# Patient Record
Sex: Male | Born: 1980 | Race: Black or African American | Hispanic: No | Marital: Married | Smoking: Never smoker
Health system: Southern US, Community
[De-identification: ages and names within clinical notes are randomized; demographics above are authoritative.]

---

## 2005-04-22 ENCOUNTER — Emergency Department (HOSPITAL_COMMUNITY): Admission: EM | Admit: 2005-04-22 | Discharge: 2005-04-22 | Payer: Self-pay | Admitting: Emergency Medicine

## 2011-07-17 ENCOUNTER — Emergency Department (HOSPITAL_COMMUNITY)
Admission: EM | Admit: 2011-07-17 | Discharge: 2011-07-17 | Disposition: A | Discharge status: Home / Self Care | Payer: 59 | Attending: Emergency Medicine | Admitting: Emergency Medicine

## 2011-07-17 ENCOUNTER — Encounter (HOSPITAL_COMMUNITY): Payer: Self-pay | Admitting: Emergency Medicine

## 2011-07-17 DIAGNOSIS — R51 Headache: Secondary | ICD-10-CM | POA: Insufficient documentation

## 2011-07-17 DIAGNOSIS — R509 Fever, unspecified: Secondary | ICD-10-CM

## 2011-07-17 DIAGNOSIS — N390 Urinary tract infection, site not specified: Secondary | ICD-10-CM | POA: Insufficient documentation

## 2011-07-17 LAB — URINALYSIS, ROUTINE W REFLEX MICROSCOPIC: Nitrite: NEGATIVE

## 2011-07-17 LAB — URINE MICROSCOPIC-ADD ON

## 2011-07-17 LAB — GLUCOSE, CAPILLARY: Glucose-Capillary: 83 mg/dL (ref 70–99)

## 2011-07-17 MED ORDER — CEPHALEXIN 500 MG PO CAPS: 500.0000 mg | ORAL_CAPSULE | Freq: Four times a day (QID) | ORAL | Status: AC

## 2011-07-17 MED ORDER — ACETAMINOPHEN 325 MG PO TABS
650.0000 mg | ORAL_TABLET | Freq: Once | ORAL | Status: AC
  Administered 2011-07-17: 650 mg via ORAL
  Filled 2011-07-17: qty 2

## 2011-07-17 MED ORDER — CEPHALEXIN 250 MG PO CAPS
500.0000 mg | ORAL_CAPSULE | Freq: Once | ORAL | Status: AC
  Administered 2011-07-17: 500 mg via ORAL
  Filled 2011-07-17: qty 2

## 2011-07-17 MED ORDER — IBUPROFEN 200 MG PO TABS
600.0000 mg | ORAL_TABLET | Freq: Once | ORAL | Status: AC
  Administered 2011-07-17: 600 mg via ORAL
  Filled 2011-07-17: qty 3

## 2011-07-17 NOTE — ED Provider Notes (Signed)
History   This chart was scribed for Oscar Co, MD by Toya Smothers. The patient was seen in room STRE8/STRE8. Patient's care was started at 1655.  CSN: [id number]  Arrival date & time 07/17/11  1655   First MD Initiated Contact with Patient 07/17/11 1900      Chief Complaint  Patient presents with  . Chills  . Headache   HPI  Oscar Fields is a 31 y.o. male who presents to the Emergency Department complaining of sudden onset moderate sever constant chills onset 4 hours ago with associated nausea and increased frequency, denying dysuria, hematuria, congestion, and rhinorrhea. Pt states that he was at work when suddenly he began to feel extremely cold. Pt is a nonsmoker and denies but admits the use of alcohol.  History reviewed. No pertinent past medical history.  History reviewed. No pertinent past surgical history.  No family history on file.  History  Substance Use Topics  . Smoking status: Never Smoker   . Smokeless tobacco: Not on file  . Alcohol Use: Yes   Review of Systems  Constitutional: Positive for chills.  HENT: Negative for congestion, sore throat and rhinorrhea.   Respiratory: Negative for shortness of breath.   Gastrointestinal: Negative for nausea, vomiting, abdominal pain, diarrhea and blood in stool.  Genitourinary: Positive for frequency. Negative for dysuria and hematuria.  Neurological: Negative for weakness.  A complete 10 system review of systems was obtained and all systems are negative except as noted in the HPI and PMH.    Allergies  Review of patient's allergies indicates no known allergies.  Home Medications  No current outpatient prescriptions on file.  BP 125/77  Temp(Src) 100.4 F (38 C) (Oral)  Resp 20  SpO2 95%  Physical Exam  Nursing note and vitals reviewed. Constitutional: He is oriented to person, place, and time. He appears well-developed and well-nourished. No distress.  HENT:  Head: Normocephalic and atraumatic.    Eyes: EOM are normal.  Neck: Neck supple. No tracheal deviation present.  Cardiovascular: Normal rate, regular rhythm and normal heart sounds.  Exam reveals no gallop and no friction rub.   No murmur heard. Pulmonary/Chest: Effort normal and breath sounds normal. No respiratory distress. He has no wheezes. He has no rales.  Musculoskeletal: Normal range of motion.  Neurological: He is alert and oriented to person, place, and time.  Skin: Skin is warm and dry.  Psychiatric: He has a normal mood and affect. His behavior is normal.    ED Course  Procedures (including critical care time)  DIAGNOSTIC STUDIES: Oxygen Saturation is 95% on room air, adequate by my interpretation.    COORDINATION OF CARE: 7:00PM- Evaluated Pt's current condition  DIAGNOSTIC STUDIES: Oxygen Saturation is 95% on room  air, normal by my interpretation.    COORDINATION OF CARE:    Labs Reviewed  URINALYSIS, ROUTINE W REFLEX MICROSCOPIC - Abnormal; Notable for the following:    Leukocytes, UA SMALL (*)    All other components within normal limits  GLUCOSE, CAPILLARY  URINE MICROSCOPIC-ADD ON   No results found.   1. Fever   2. Urinary tract infection       MDM  The only symptoms regarding infection that this patient has a some urinary frequency.  He does have some leukocytes and a few white blood cells in his urine.  He'll be covered for possible urinary tract infection.  This may represent viral syndrome.  Discharge home in good condition.  The patient understands  to return to the ER for new or worsening symptoms.  He reports he feels much better after antifever medicine at this time   I personally performed the services described in this documentation, which was scribed in my presence. The recorded information has been reviewed and considered.          Oscar Co, MD 07/17/11 2013

## 2011-07-17 NOTE — Discharge Instructions (Signed)
Fever  Fever is a higher-than-normal body temperature. A normal temperature varies with:  Age.   How it is measured (mouth, underarm, rectal, or ear).   Time of day.  In an adult, an oral temperature around 98.6 Fahrenheit (F) or 37 Celsius (C) is considered normal. A rise in temperature of about 1.8 F or 1 C is generally considered a fever (100.4 F or 38 C). In an infant age 32 days or less, a rectal temperature of 100.4 F (38 C) generally is regarded as fever. Fever is not a disease but can be a symptom of illness. CAUSES   Fever is most commonly caused by infection.   Some non-infectious problems can cause fever. For example:   Some arthritis problems.   Problems with the thyroid or adrenal glands.   Immune system problems.   Some kinds of cancer.   A reaction to certain medicines.   Occasionally, the source of a fever cannot be determined. This is sometimes called a "Fever of Unknown Origin" (FUO).   Some situations may lead to a temporary rise in body temperature that may go away on its own. Examples are:   Childbirth.   Surgery.   Some situations may cause a rise in body temperature but these are not considered "true fever". Examples are:   Intense exercise.   Dehydration.   Exposure to high outside or room temperatures.  SYMPTOMS   Feeling warm or hot.   Fatigue or feeling exhausted.   Aching all over.   Chills.   Shivering.   Sweats.  DIAGNOSIS  A fever can be suspected by your caregiver feeling that your skin is unusually warm. The fever is confirmed by taking a temperature with a thermometer. Temperatures can be taken different ways. Some methods are accurate and some are not: With adults, adolescents, and children:   An oral temperature is used most commonly.   An ear thermometer will only be accurate if it is positioned as recommended by the manufacturer.   Under the arm temperatures are not accurate and not recommended.   Most  electronic thermometers are fast and accurate.  Infants and Toddlers:  Rectal temperatures are recommended and most accurate.   Ear temperatures are not accurate in this age group and are not recommended.   Skin thermometers are not accurate.  RISKS AND COMPLICATIONS   During a fever, the body uses more oxygen, so a person with a fever may develop rapid breathing or shortness of breath. This can be dangerous especially in people with heart or lung disease.   The sweats that occur following a fever can cause dehydration.   High fever can cause seizures in infants and children.   Older persons can develop confusion during a fever.  TREATMENT   Medications may be used to control temperature.   Do not give aspirin to children with fevers. There is an association with Reye's syndrome. Reye's syndrome is a rare but potentially deadly disease.   If an infection is present and medications have been prescribed, take them as directed. Finish the full course of medications until they are gone.   Sponging or bathing with room-temperature water may help reduce body temperature. Do not use ice water or alcohol sponge baths.   Do not over-bundle children in blankets or heavy clothes.   Drinking adequate fluids during an illness with fever is important to prevent dehydration.  HOME CARE INSTRUCTIONS   For adults, rest and adequate fluid intake are important. Dress according   to how you feel, but do not over-bundle.   Drink enough water and/or fluids to keep your urine clear or pale yellow.   For infants over 3 months and children, giving medication as directed by your caregiver to control fever can help with comfort. The amount to be given is based on the child's weight. Do NOT give more than is recommended.  SEEK MEDICAL CARE IF:   You or your child are unable to keep fluids down.   Vomiting or diarrhea develops.   You develop a skin rash.   An oral temperature above 102 F (38.9 C)  develops, or a fever which persists for over 3 days.   You develop excessive weakness, dizziness, fainting or extreme thirst.   Fevers keep coming back after 3 days.  SEEK IMMEDIATE MEDICAL CARE IF:   Shortness of breath or trouble breathing develops   You pass out.   You feel you are making little or no urine.   New pain develops that was not there before (such as in the head, neck, chest, back, or abdomen).   You cannot hold down fluids.   Vomiting and diarrhea persist for more than a day or two.   You develop a stiff neck and/or your eyes become sensitive to light.   An unexplained temperature above 102 F (38.9 C) develops.  Document Released: 02/07/2005 Document Revised: 01/27/2011 Document Reviewed: 01/24/2008 [employer] Patient Information 2012 [employer]. 

## 2011-07-17 NOTE — ED Notes (Signed)
Pt reports while at work today he began to experience generalized chills and body aches. Pt denies any associating symptoms - pt in no acute distress at present, A&Ox4.

## 2011-07-17 NOTE — ED Notes (Addendum)
C/o chills, body aches, dry mouth, blurred vision, and "slight" headache that started around 2:30pm today while at work.

## 2011-07-17 NOTE — ED Notes (Signed)
[facility] 83 Rn notified Clydie Braun

## 2012-10-19 ENCOUNTER — Ambulatory Visit: Payer: 59 | Admitting: Internal Medicine

## 2012-11-13 ENCOUNTER — Ambulatory Visit (INDEPENDENT_AMBULATORY_CARE_PROVIDER_SITE_OTHER): Payer: 59 | Admitting: Medical

## 2012-11-13 ENCOUNTER — Encounter: Payer: Self-pay | Admitting: Medical

## 2012-11-13 VITALS — BP 122/80 | HR 78 | Temp 98.2°F | Resp 16 | Ht 70.2 in | Wt 315.0 lb

## 2012-11-13 DIAGNOSIS — B350 Tinea barbae and tinea capitis: Secondary | ICD-10-CM

## 2012-11-13 DIAGNOSIS — Z23 Encounter for immunization: Secondary | ICD-10-CM

## 2012-11-13 DIAGNOSIS — Z0000 Encounter for general adult medical examination without abnormal findings: Secondary | ICD-10-CM

## 2012-11-13 DIAGNOSIS — L738 Other specified follicular disorders: Secondary | ICD-10-CM

## 2012-11-13 DIAGNOSIS — Z7184 Encounter for health counseling related to travel: Secondary | ICD-10-CM

## 2012-11-13 DIAGNOSIS — E669 Obesity, unspecified: Secondary | ICD-10-CM

## 2012-11-13 DIAGNOSIS — K029 Dental caries, unspecified: Secondary | ICD-10-CM

## 2012-11-13 DIAGNOSIS — Z7189 Other specified counseling: Secondary | ICD-10-CM

## 2012-11-13 LAB — COMPREHENSIVE METABOLIC PANEL
ALT: 28 U/L (ref 0–53)
AST: 22 U/L (ref 0–37)
Albumin: 4.2 g/dL (ref 3.5–5.2)
Alkaline Phosphatase: 74 U/L (ref 39–117)
BUN: 10 mg/dL (ref 6–23)
Calcium: 9.6 mg/dL (ref 8.4–10.5)
Creat: 1.02 mg/dL (ref 0.50–1.35)
Glucose, Bld: 86 mg/dL (ref 70–99)
Potassium: 4.3 mEq/L (ref 3.5–5.3)
Sodium: 138 mEq/L (ref 135–145)
Total Bilirubin: 1.2 mg/dL (ref 0.3–1.2)
Total Protein: 7.4 g/dL (ref 6.0–8.3)

## 2012-11-13 LAB — LIPID PANEL
Cholesterol: 211 mg/dL — ABNORMAL HIGH (ref 0–200)
Total CHOL/HDL Ratio: 5.1 Ratio
Triglycerides: 104 mg/dL (ref <150)
VLDL: 21 mg/dL (ref 0–40)

## 2012-11-13 LAB — POCT URINALYSIS DIPSTICK
Leukocytes, UA: NEGATIVE
Nitrite, UA: NEGATIVE

## 2012-11-13 LAB — CBC WITH DIFFERENTIAL/PLATELET
Basophils Absolute: 0 10*3/uL (ref 0.0–0.1)
Basophils Relative: 0 % (ref 0–1)
Lymphs Abs: 1.9 10*3/uL (ref 0.7–4.0)
Monocytes Relative: 9 % (ref 3–12)
Neutrophils Relative %: 65 % (ref 43–77)
WBC: 7.6 10*3/uL (ref 4.0–10.5)

## 2012-11-13 MED ORDER — TRETINOIN 0.05 % EX CREA: TOPICAL_CREAM | Freq: Every day | CUTANEOUS | Status: DC

## 2012-11-13 MED ORDER — LORCASERIN HCL 10 MG PO TABS: 1.0000 | ORAL_TABLET | Freq: Two times a day (BID) | ORAL | Status: DC

## 2012-11-13 NOTE — Addendum Note (Signed)
Addended by: Jac Canavan on: 11/13/2012 01:37 PM   Modules accepted: Orders

## 2012-11-13 NOTE — Patient Instructions (Addendum)
Recommendations: See dentist for routine care.  Dr. Yancey Flemings, dentist [address], [place], [place] [address] [phone] [email or web address]  Begin Belviq tablets for weight loss.  Work on getting at least 150 minutes of exercise daily.  Work on [employer] changes we discussed.    Increase water intake.   Recheck 1-2 months regarding weight.    Preventative Care for Adults, Male       REGULAR HEALTH EXAMS:  A routine yearly physical is a good way to check in with your primary care provider about your health and preventive screening. It is also an opportunity to share updates about your health and any concerns you have, and receive a thorough all-over exam.   Most health insurance companies pay for at least some preventative services.  Check with your health plan for specific coverages.  WHAT PREVENTATIVE SERVICES DO MEN NEED?  Adult men should have their weight and blood pressure checked regularly.   Men age 8 and older should have their cholesterol levels checked regularly.  Beginning at age 69 and continuing to age 26, men should be screened for colorectal cancer.  Certain people should may need continued testing until age 36.  Other cancer screening may include exams for testicular and prostate cancer.  Updating vaccinations is part of preventative care.  Vaccinations help protect against diseases such as the flu.  Lab tests are generally done as part of preventative care to screen for anemia and blood disorders, to screen for problems with the kidneys and liver, to screen for bladder problems, to check blood sugar, and to check your cholesterol level.  Preventative services generally include counseling about diet, exercise, avoiding tobacco, drugs, excessive alcohol consumption, and sexually transmitted infections.    GENERAL RECOMMENDATIONS FOR GOOD HEALTH:  Healthy diet:  Eat a variety of foods, including fruit, vegetables, animal or vegetable protein, such as  meat, fish, chicken, and eggs, or beans, lentils, tofu, and grains, such as rice.  Drink plenty of water daily.  Decrease saturated fat in the diet, avoid lots of red meat, processed foods, sweets, fast foods, and fried foods.  Exercise:  Aerobic exercise helps maintain good heart health. At least 30-40 minutes of moderate-intensity exercise is recommended. For example, a brisk walk that increases your heart rate and breathing. This should be done on most days of the week.   Find a type of exercise or a variety of exercises that you enjoy so that it becomes a part of your daily life.  Examples are running, walking, swimming, water aerobics, and biking.  For motivation and support, explore group exercise such as aerobic class, spin class, Zumba, Yoga,or  martial arts, etc.    Set exercise goals for yourself, such as a certain weight goal, walk or run in a race such as a 5k walk/run.  Speak to your primary care provider about exercise goals.  Disease prevention:  If you smoke or chew tobacco, find out from your caregiver how to quit. It can literally save your life, no matter how long you have been a tobacco user. If you do not use tobacco, never begin.   Maintain a healthy diet and normal weight. Increased weight leads to problems with blood pressure and diabetes.   The Body Mass Index or BMI is a way of measuring how much of your body is fat. Having a BMI above 27 increases the risk of heart disease, diabetes, hypertension, stroke and other problems related to obesity. Your caregiver can help determine your  BMI and based on it develop an exercise and dietary program to help you achieve or maintain this important measurement at a healthful level.  High blood pressure causes heart and blood vessel problems.  Persistent high blood pressure should be treated with medicine if weight loss and exercise do not work.   Fat and cholesterol leaves deposits in your arteries that can block them. This  causes heart disease and vessel disease elsewhere in your body.  If your cholesterol is found to be high, or if you have heart disease or certain other medical conditions, then you may need to have your cholesterol monitored frequently and be treated with medication.   Ask if you should have a stress test if your history suggests this. A stress test is a test done on a treadmill that looks for heart disease. This test can find disease prior to there being a problem.  Avoid drinking alcohol in excess (more than two drinks per day).  Avoid use of street drugs. Do not share needles with anyone. Ask for professional help if you need assistance or instructions on stopping the use of alcohol, cigarettes, and/or drugs.  Brush your teeth twice a day with fluoride toothpaste, and floss once a day. Good oral hygiene prevents tooth decay and gum disease. The problems can be painful, unattractive, and can cause other health problems. Visit your dentist for a routine oral and dental check up and preventive care every 6-12 months.   Look at your skin regularly.  Use a mirror to look at your back. Notify your caregivers of changes in moles, especially if there are changes in shapes, colors, a size larger than a pencil eraser, an irregular border, or development of new moles.  Safety:  Use seatbelts 100% of the time, whether driving or as a passenger.  Use safety devices such as hearing protection if you work in environments with loud noise or significant background noise.  Use safety glasses when doing any work that could send debris in to the eyes.  Use a helmet if you ride a bike or motorcycle.  Use appropriate safety gear for contact sports.  Talk to your caregiver about gun safety.  Use sunscreen with a SPF (or skin protection factor) of 15 or greater.  Lighter skinned people are at a greater risk of skin cancer. Don't forget to also wear sunglasses in order to protect your eyes from too much damaging sunlight.  Damaging sunlight can accelerate cataract formation.   Practice safe sex. Use condoms. Condoms are used for birth control and to help reduce the spread of sexually transmitted infections (or STIs).  Some of the STIs are gonorrhea (the clap), chlamydia, syphilis, trichomonas, herpes, HPV (human papilloma virus) and HIV (human immunodeficiency virus) which causes AIDS. The herpes, HIV and HPV are viral illnesses that have no cure. These can result in disability, cancer and death.   Keep carbon monoxide and smoke detectors in your home functioning at all times. Change the batteries every 6 months or use a model that plugs into the wall.   Vaccinations:  Stay up to date with your tetanus shots and other required immunizations. You should have a booster for tetanus every 10 years. Be sure to get your flu shot every year, since 5%-20% of the [place] population comes down with the flu. The flu vaccine changes each year, so being vaccinated once is not enough. Get your shot in the fall, before the flu season peaks.   Other vaccines to consider:  Pneumococcal vaccine to protect against certain types of pneumonia.  This is normally recommended for adults age 1 or older.  However, adults younger than 33 years old with certain underlying conditions such as diabetes, heart or lung disease should also receive the vaccine.  Shingles vaccine to protect against Varicella Zoster if you are older than age 42, or younger than 33 years old with certain underlying illness.  Hepatitis A vaccine to protect against a form of infection of the liver by a virus acquired from food.  Hepatitis B vaccine to protect against a form of infection of the liver by a virus acquired from blood or body fluids, particularly if you work in health care.  If you plan to travel internationally, check with your local health department for specific vaccination recommendations.  Cancer Screening:  Most routine colon cancer screening begins at  the age of 74. On a yearly basis, doctors may provide special easy to use take-home tests to check for hidden blood in the stool. Sigmoidoscopy or colonoscopy can detect the earliest forms of colon cancer and is life saving. These tests use a small camera at the end of a tube to directly examine the colon. Speak to your caregiver about this at age 39, when routine screening begins (and is repeated every 5 years unless early forms of pre-cancerous polyps or small growths are found).   At the age of 60 men usually start screening for prostate cancer every year. Screening may begin at a younger age for those with higher risk. Those at higher risk include African-Americans or having a family history of prostate cancer. There are two types of tests for prostate cancer:   Prostate-specific antigen (PSA) testing. Recent studies raise questions about prostate cancer using PSA and you should discuss this with your caregiver.   Digital rectal exam (in which your doctor's lubricated and gloved finger feels for enlargement of the prostate through the anus).   Screening for testicular cancer.  Do a monthly exam of your testicles. Gently roll each testicle between your thumb and fingers, feeling for any abnormal lumps. The best time to do this is after a hot shower or bath when the tissues are looser. Notify your caregivers of any lumps, tenderness or changes in size or shape immediately.

## 2012-11-13 NOTE — Progress Notes (Signed)
Subjective:   HPI  Oscar Fields is a 32 y.o. male who presents for a complete physical.  New patient today. No routine care since he was a child.     Preventative care: Last ophthalmology visit:n/a Last dental visit:n/a Last colonoscopy:n/a Last prostate exam: never Last JYN:WGNFA Last labs: ?  Prior vaccinations: Unsure, born in [place]  Advanced directive:n/a Health care power of attorney:n/a Living will:n/a  Concerns:  Obesity - has had trouble losing weight for years.  Gained weight in the last 3-4 years; busy with life, has young child, works a lot of hours, trying to get exercise but difficult.   Travel - traveling to [place] in December, wife has family there.  Wants advise on vaccines, travel.    History reviewed. No pertinent past medical history.  History reviewed. No pertinent past surgical history.  History   Social History  . Marital Status: Married    Spouse Name: N/A    Number of Children: N/A  . Years of Education: N/A   Occupational History  . Not on file.   Social History Main Topics  . Smoking status: Never Smoker   . Smokeless tobacco: Not on file  . Alcohol Use: 1.8 oz/week    3 Cans of beer per week  . Drug Use: No  . Sexual Activity: Not on file   Other Topics Concern  . 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  Exercises 2 days per week with basketball and golf.     Family History  Problem Relation Age of Onset  . Diabetes Paternal Grandmother   . Cancer Neg Hx   . Heart disease Neg Hx   . Stroke Neg Hx   . Hypertension Neg Hx     Current outpatient prescriptions:Lorcaserin HCl (BELVIQ) 10 MG TABS, Take 1 tablet by mouth 2 (two) times daily., Disp: 60 tablet, Rfl: 1  No Known Allergies  Reviewed their medical, surgical, family, social, medication, and allergy history and updated chart as appropriate.   Review of Systems Constitutional: -fever, -chills,  -sweats, -unexpected weight change, -decreased appetite, -fatigue Allergy: -sneezing, -itching, -congestion Dermatology: -changing moles, --rash, -lumps ENT: -runny nose, -ear pain, -sore throat, -hoarseness, -sinus pain, -teeth pain, - ringing in ears, -hearing loss, -nosebleeds Cardiology: -chest pain, -palpitations, -swelling, -difficulty breathing when lying flat, -waking up short of breath Respiratory: -cough, -shortness of breath, -difficulty breathing with exercise or exertion, -wheezing, -coughing up blood Gastroenterology: -abdominal pain, -nausea, -vomiting, -diarrhea, -constipation, -blood in stool, -changes in bowel movement, -difficulty swallowing or eating Hematology: -bleeding, -bruising  Musculoskeletal: -joint aches, -muscle aches, -joint swelling, -back pain, -neck pain, -cramping, -changes in gait Ophthalmology: denies vision changes, eye redness, itching, discharge Urology: -burning with urination, -difficulty urinating, -blood in urine, -urinary frequency, -urgency, -incontinence Neurology: -headache, -weakness, -tingling, -numbness, -memory loss, -falls, -dizziness Psychology: -depressed mood, -agitation, -sleep problems     Objective:   Physical Exam  BP 122/80  Pulse 78  Temp(Src) 98.2 F (36.8 C) (Oral)  Resp 16  Ht 5' 10.2" (1.783 m)  Wt 315 lb (142.883 kg)  BMI 44.94 kg/m2  General appearance: alert, no distress, WD/WN, obese AA male Skin: no worrisome lesions, few benign appearing macules seen, bear area with some papular lesoins, mild folliculitis barbae HEENT: normocephalic, conjunctiva/corneas normal, sclerae anicteric, PERRLA, EOMi, nares patent, no discharge or erythema, pharynx normal Oral cavity: MMM, tongue normal, left upper molar with obvious decay, otherwise  rest of teeth in good repair Neck: supple, no lymphadenopathy, no thyromegaly, no masses, normal ROM, no JVD or bruits Chest: non tender, normal shape and expansion Heart: RRR, normal S1,  S2, no murmurs Lungs: CTA bilaterally, no wheezes, rhonchi, or rales Abdomen: +bs, soft, non tender, non distended, no masses, no hepatomegaly, no splenomegaly, no bruits Back: non tender, normal ROM, no scoliosis Musculoskeletal: upper extremities non tender, no obvious deformity, normal ROM throughout, lower extremities non tender, no obvious deformity, normal ROM throughout Extremities: no edema, no cyanosis, no clubbing Pulses: 2+ symmetric, upper and lower extremities, normal cap refill Neurological: alert, oriented x 3, CN2-12 intact, strength normal upper extremities and lower extremities, sensation normal throughout, DTRs 2+ throughout, no cerebellar signs, gait normal Psychiatric: normal affect, behavior normal, pleasant  GU: normal male external genitalia, circumcised, nontender, no masses, no hernia, no lymphadenopathy Rectal: deferred   Assessment and Plan :      Encounter Diagnoses  Name Primary?  . Routine general medical examination at a health care facility Yes  . Obesity, unspecified   . Need for Tdap vaccination   . Need for prophylactic vaccination and inoculation against influenza   . Counseling about travel   . Folliculitis barbae   . Dental caries     Physical exam - discussed healthy lifestyle, diet, exercise, preventative care, vaccinations, and addressed their concerns.   Advise he see dentist.   Counseled on the Tdap (tetanus, diptheria, and acellular pertussis) vaccine.  Vaccine information sheet given. Tdap vaccine given after consent obtained. Counseled on the influenza virus vaccine.  Vaccine information sheet given.  Influenza vaccine given after consent obtained.  Obesity - General weight loss/lifestyle modification strategies discussed including calorie restriction, using frequent small meals, avoiding processed, high fat, high cal, fast, and fried foods.  Increase water intake.  Set short term goals.  Informal exercise measures discussed, e.g. taking  stairs instead of elevator.  Regular aerobic exercise advised.  Discussed medication options.  Discussed risks/benefits of medications.  Prescription today for Belviq..  Travel counseling for [place] per [employer] site.    Advised he hunt down copies of his child hood vaccines or we can do titers.  He will try and get copy of vaccines . He thinks he has had Hepatitis vaccines prior thought.  He will consider Typhoid vaccine.  Per where is he going in [place], CDC recommends against malaria prophylaxis.  I gave him the option of malaria prophylaxis and he declines at this time.  Discussed other routine safety and health counseling for [place] travel.   Follow-up pending labs, 58mo

## 2012-11-14 ENCOUNTER — Telehealth: Payer: Self-pay | Admitting: Family Medicine

## 2012-11-14 NOTE — Telephone Encounter (Signed)
Patient is aware of the medication that was sent to the pharmacy. CLS 

## 2012-11-14 NOTE — Telephone Encounter (Signed)
Message copied by Janeice Robinson on Wed Nov 14, 2012  4:12 PM ------      Message from: Jac Canavan      Created: Tue Nov 13, 2012  1:37 PM       I forgot to mention, but I sent prescription for retin A cream to begin using at bedtime for his beard bumps.  This is called Folliculitis Barbae.  Lets see if this medication helps.  ------

## 2014-02-16 ENCOUNTER — Emergency Department (HOSPITAL_BASED_OUTPATIENT_CLINIC_OR_DEPARTMENT_OTHER): Payer: BC Managed Care – PPO

## 2014-02-16 ENCOUNTER — Encounter (HOSPITAL_BASED_OUTPATIENT_CLINIC_OR_DEPARTMENT_OTHER): Payer: Self-pay

## 2014-02-16 ENCOUNTER — Emergency Department (HOSPITAL_BASED_OUTPATIENT_CLINIC_OR_DEPARTMENT_OTHER)
Admission: EM | Admit: 2014-02-16 | Discharge: 2014-02-16 | Disposition: A | Discharge status: Home / Self Care | Payer: BC Managed Care – PPO | Attending: Emergency Medicine | Admitting: Emergency Medicine

## 2014-02-16 DIAGNOSIS — S63501A Unspecified sprain of right wrist, initial encounter: Secondary | ICD-10-CM

## 2014-02-16 DIAGNOSIS — S6991XA Unspecified injury of right wrist, hand and finger(s), initial encounter: Secondary | ICD-10-CM | POA: Diagnosis present

## 2014-02-16 DIAGNOSIS — Y9289 Other specified places as the place of occurrence of the external cause: Secondary | ICD-10-CM | POA: Insufficient documentation

## 2014-02-16 DIAGNOSIS — Y9389 Activity, other specified: Secondary | ICD-10-CM | POA: Diagnosis not present

## 2014-02-16 DIAGNOSIS — Y998 Other external cause status: Secondary | ICD-10-CM | POA: Insufficient documentation

## 2014-02-16 DIAGNOSIS — W19XXXA Unspecified fall, initial encounter: Secondary | ICD-10-CM

## 2014-02-16 DIAGNOSIS — W1839XA Other fall on same level, initial encounter: Secondary | ICD-10-CM | POA: Insufficient documentation

## 2014-02-16 MED ORDER — IBUPROFEN 800 MG PO TABS: 800.0000 mg | ORAL_TABLET | Freq: Three times a day (TID) | ORAL | Status: AC

## 2014-02-16 NOTE — ED Provider Notes (Signed)
CSN: [id number]     Arrival date & time 02/16/14  1116 History   First MD Initiated Contact with Patient 02/16/14 1156     Chief Complaint  Patient presents with  . Wrist Injury     (Consider location/radiation/quality/duration/timing/severity/associated sxs/prior Treatment) HPI Comments: Patient presents for right wrist injury. Patient reports that he fell last night and tried to catch himself by putting his arm out. He has been having pain on the lateral portion of the wrist since the fall. Pain is moderate, worsens with movement. No other injury.  Patient is a 33 y.o. male presenting with wrist injury.  Wrist Injury   History reviewed. No pertinent past medical history. History reviewed. No pertinent past surgical history. Family History  Problem Relation Age of Onset  . Diabetes Paternal Grandmother   . Cancer Neg Hx   . Heart disease Neg Hx   . Stroke Neg Hx   . Hypertension Neg Hx    History  Substance Use Topics  . Smoking status: Never Smoker   . Smokeless tobacco: Not on file  . Alcohol Use: 1.8 oz/week    3 Cans of beer per week    Review of Systems  Musculoskeletal: Positive for arthralgias.      Allergies  Review of patient's allergies indicates no known allergies.  Home Medications   Prior to Admission medications   Not on File   BP 139/91 mmHg  Pulse 84  Temp(Src) 98.6 F (37 C) (Oral)  Resp 18  SpO2 96% Physical Exam  Musculoskeletal:       Right wrist: He exhibits tenderness. He exhibits no swelling and no deformity.       Arms: Neurological: He is alert. No cranial nerve deficit or sensory deficit. GCS eye subscore is 4. GCS verbal subscore is 5. GCS motor subscore is 6.  Skin: Skin is warm, dry and intact.    ED Course  Procedures (including critical care time) Labs Review Labs Reviewed - No data to display  Imaging Review Dg Wrist Complete Right  02/16/2014   CLINICAL DATA:  Fall last night, acute right wrist pain and injury   EXAM: RIGHT WRIST - COMPLETE 3+ VIEW  COMPARISON:  None.  FINDINGS: There is no evidence of fracture or dislocation. There is no evidence of arthropathy or other focal bone abnormality. Soft tissues are unremarkable.  IMPRESSION: Negative.   Electronically Signed   By: Ruel Favorsrevor  Shick M.D.   On: 02/16/2014 12:21     EKG Interpretation None      MDM   Final diagnoses:  Fall  wrist sprain      Gilda Creasehristopher J. Pollina, MD 02/16/14 [phone]

## 2014-02-16 NOTE — Discharge Instructions (Signed)
Wrist Sprain  with Rehab  A sprain is an injury in which a ligament that maintains the proper alignment of a joint is partially or completely torn. The ligaments of the wrist are susceptible to sprains. Sprains are classified into three categories. Grade 1 sprains cause pain, but the tendon is not lengthened. Grade 2 sprains include a lengthened ligament because the ligament is stretched or partially ruptured. With grade 2 sprains there is still function, although the function may be diminished. Grade 3 sprains are characterized by a complete tear of the tendon or muscle, and function is usually impaired.  SYMPTOMS   · Pain tenderness, inflammation, and/or bruising (contusion) of the injury.  · A "pop" or tear felt and/or heard at the time of injury.  · Decreased wrist function.  CAUSES   A wrist sprain occurs when a force is placed on one or more ligaments that is greater than it/they can withstand. Common mechanisms of injury include:  · Catching a ball with you hands.  · Repetitive and/ or strenuous extension or flexion of the wrist.  RISK INCREASES WITH:  · Previous wrist injury.  · Contact sports (boxing or wrestling).  · Activities in which falling is common.  · Poor strength and flexibility.  · Improperly fitted or padded protective equipment.  PREVENTION  · Warm up and stretch properly before activity.  · Allow for adequate recovery between workouts.  · Maintain physical fitness:  ¨ Strength, flexibility, and endurance.  ¨ Cardiovascular fitness.  · Protect the wrist joint by limiting its motion with the use of taping, braces, or splints.  · Protect the wrist after injury for 6 to 12 months.  PROGNOSIS   The prognosis for wrist sprains depends on the degree of injury. Grade 1 sprains require 2 to 6 weeks of treatment. Grade 2 sprains require 6 to 8 weeks of treatment, and grade 3 sprains require up to 12 weeks.   RELATED COMPLICATIONS   · Prolonged healing time, if improperly treated or  re-injured.  · Recurrent symptoms that result in a chronic problem.  · Injury to nearby structures (bone, cartilage, nerves, or tendons).  · Arthritis of the wrist.  · Inability to compete in athletics at a high level.  · Wrist stiffness or weakness.  · Progression to a complete rupture of the ligament.  TREATMENT   Treatment initially involves resting from any activities that aggravate the symptoms, and the use of ice and medications to help reduce pain and inflammation. Your caregiver may recommend immobilizing the wrist for a period of time in order to reduce stress on the ligament and allow for healing. After immobilization it is important to perform strengthening and stretching exercises to help regain strength and a full range of motion. These exercises may be completed at home or with a therapist. Surgery is not usually required for wrist sprains, unless the ligament has been ruptured (grade 3 sprain).  MEDICATION   · If pain medication is necessary, then nonsteroidal anti-inflammatory medications, such as aspirin and ibuprofen, or other minor pain relievers, such as acetaminophen, are often recommended.  · Do not take pain medication for 7 days before surgery.  · Prescription pain relievers may be given if deemed necessary by your caregiver. Use only as directed and only as much as you need.  HEAT AND COLD  · Cold treatment (icing) relieves pain and reduces inflammation. Cold treatment should be applied for 10 to 15 minutes every 2 to 3 hours for inflammation   and pain and immediately after any activity that aggravates your symptoms. Use ice packs or massage the area with a piece of ice (ice massage).  · Heat treatment may be used prior to performing the stretching and strengthening activities prescribed by your caregiver, physical therapist, or athletic trainer. Use a heat pack or soak your injury in warm water.  SEEK MEDICAL CARE IF:  · Treatment seems to offer no benefit, or the condition worsens.  · Any  medications produce adverse side effects.  EXERCISES  RANGE OF MOTION (ROM) AND STRETCHING EXERCISES - Wrist Sprain   These exercises may help you when beginning to rehabilitate your injury. Your symptoms may resolve with or without further involvement from your physician, physical therapist or athletic trainer. While completing these exercises, remember:   · Restoring tissue flexibility helps normal motion to return to the joints. This allows healthier, less painful movement and activity.  · An effective stretch should be held for at least 30 seconds.  · A stretch should never be painful. You should only feel a gentle lengthening or release in the stretched tissue.  RANGE OF MOTION - Wrist Flexion, Active-Assisted  · Extend your right / left elbow with your fingers pointing down.*  · Gently pull the back of your hand towards you until you feel a gentle stretch on the top of your forearm.  · Hold this position for __________ seconds.  Repeat __________ times. Complete this exercise __________ times per day.   *If directed by your physician, physical therapist or athletic trainer, complete this stretch with your elbow bent rather than extended.  RANGE OF MOTION - Wrist Extension, Active-Assisted  · Extend your right / left elbow and turn your palm upwards.*  · Gently pull your palm/fingertips back so your wrist extends and your fingers point more toward the ground.  · You should feel a gentle stretch on the inside of your forearm.  · Hold this position for __________ seconds.  Repeat __________ times. Complete this exercise __________ times per day.  *If directed by your physician, physical therapist or athletic trainer, complete this stretch with your elbow bent, rather than extended.  RANGE OF MOTION - Supination, Active  · Stand or sit with your elbows at your side. Bend your right / left elbow to 90 degrees.  · Turn your palm upward until you feel a gentle stretch on the inside of your forearm.  · Hold this  position for __________ seconds. Slowly release and return to the starting position.  Repeat __________ times. Complete this stretch __________ times per day.   RANGE OF MOTION - Pronation, Active  · Stand or sit with your elbows at your side. Bend your right / left elbow to 90 degrees.  · Turn your palm downward until you feel a gentle stretch on the top of your forearm.  · Hold this position for __________ seconds. Slowly release and return to the starting position.  Repeat __________ times. Complete this stretch __________ times per day.   STRETCH - Wrist Flexion  · Place the back of your right / left hand on a tabletop leaving your elbow slightly bent. Your fingers should point away from your body.  · Gently press the back of your hand down onto the table by straightening your elbow. You should feel a stretch on the top of your forearm.  · Hold this position for __________ seconds.  Repeat __________ times. Complete this stretch __________ times per day.   STRETCH - Wrist   Extension  · Place your right / left fingertips on a tabletop leaving your elbow slightly bent. Your fingers should point backwards.  · Gently press your fingers and palm down onto the table by straightening your elbow. You should feel a stretch on the inside of your forearm.  · Hold this position for __________ seconds.  Repeat __________ times. Complete this stretch __________ times per day.   STRENGTHENING EXERCISES - Wrist Sprain  These exercises may help you when beginning to rehabilitate your injury. They may resolve your symptoms with or without further involvement from your physician, physical therapist or athletic trainer. While completing these exercises, remember:   · Muscles can gain both the endurance and the strength needed for everyday activities through controlled exercises.  · Complete these exercises as instructed by your physician, physical therapist or athletic trainer. Progress with the resistance and repetition exercises  only as your caregiver advises.  STRENGTH - Wrist Flexors  · Sit with your right / left forearm palm-up and fully supported. Your elbow should be resting below the height of your shoulder. Allow your wrist to extend over the edge of the surface.  · Loosely holding a __________ weight or a piece of rubber exercise band/tubing, slowly curl your hand up toward your forearm.  · Hold this position for __________ seconds. Slowly lower the wrist back to the starting position in a controlled manner.  Repeat __________ times. Complete this exercise __________ times per day.   STRENGTH - Wrist Extensors  · Sit with your right / left forearm palm-down and fully supported. Your elbow should be resting below the height of your shoulder. Allow your wrist to extend over the edge of the surface.  · Loosely holding a __________ weight or a piece of rubber exercise band/tubing, slowly curl your hand up toward your forearm.  · Hold this position for __________ seconds. Slowly lower the wrist back to the starting position in a controlled manner.  Repeat __________ times. Complete this exercise __________ times per day.   STRENGTH - Ulnar Deviators  · Stand with a ____________________ weight in your right / left hand, or sit holding on to the rubber exercise band/tubing with your opposite arm supported.  · Move your wrist so that your pinkie travels toward your forearm and your thumb moves away from your forearm.  · Hold this position for __________ seconds and then slowly lower the wrist back to the starting position.  Repeat __________ times. Complete this exercise __________ times per day  STRENGTH - Radial Deviators  · Stand with a ____________________ weight in your  · right / left hand, or sit holding on to the rubber exercise band/tubing with your arm supported.  · Raise your hand upward in front of you or pull up on the rubber tubing.  · Hold this position for __________ seconds and then slowly lower the wrist back to the  starting position.  Repeat __________ times. Complete this exercise __________ times per day.  STRENGTH - Forearm Supinators  · Sit with your right / left forearm supported on a table, keeping your elbow below shoulder height. Rest your hand over the edge, palm down.  · Gently grip a hammer or a soup ladle.  · Without moving your elbow, slowly turn your palm and hand upward to a "thumbs-up" position.  · Hold this position for __________ seconds. Slowly return to the starting position.  Repeat __________ times. Complete this exercise __________ times per day.   STRENGTH - Forearm   Pronators  · Sit with your right / left forearm supported on a table, keeping your elbow below shoulder height. Rest your hand over the edge, palm up.  · Gently grip a hammer or a soup ladle.  · Without moving your elbow, slowly turn your palm and hand upward to a "thumbs-up" position.  · Hold this position for __________ seconds. Slowly return to the starting position.  Repeat __________ times. Complete this exercise __________ times per day.   STRENGTH - Grip  · Grasp a tennis ball, a dense sponge, or a large, rolled sock in your hand.  · Squeeze as hard as you can without increasing any pain.  · Hold this position for __________ seconds. Release your grip slowly.  Repeat __________ times. Complete this exercise __________ times per day.   Document Released: 02/07/2005 Document Revised: 05/02/2011 Document Reviewed: 05/22/2008  [employer]® Patient Information ©2015 [employer]. This information is not intended to replace advice given to you by your health care provider. Make sure you discuss any questions you have with your health care provider.

## 2014-02-16 NOTE — ED Notes (Signed)
Patient here with right wrist injury after falling last night and attempting to catch self with same, no deformity noted

## 2015-11-09 IMAGING — CR DG WRIST COMPLETE 3+V*R*
4 series · 4 of 4 positions shown · non-contrast
Comparison: None.

CLINICAL DATA: Fall last night, acute right wrist pain and injury

EXAM:
RIGHT WRIST - COMPLETE 3+ VIEW

[x wrist pa right]
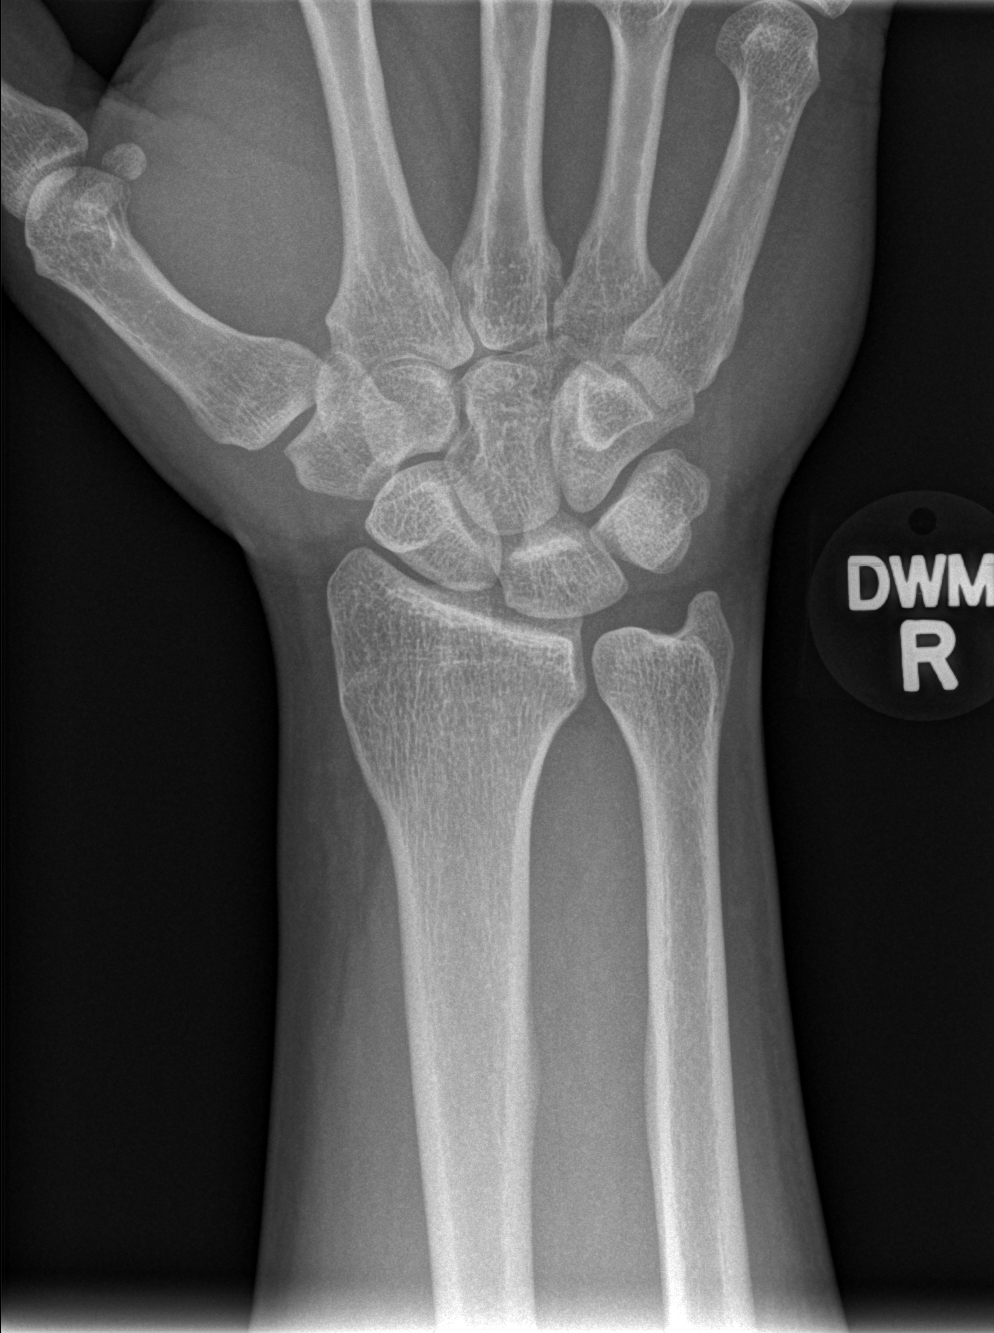

[x wrist obl right]
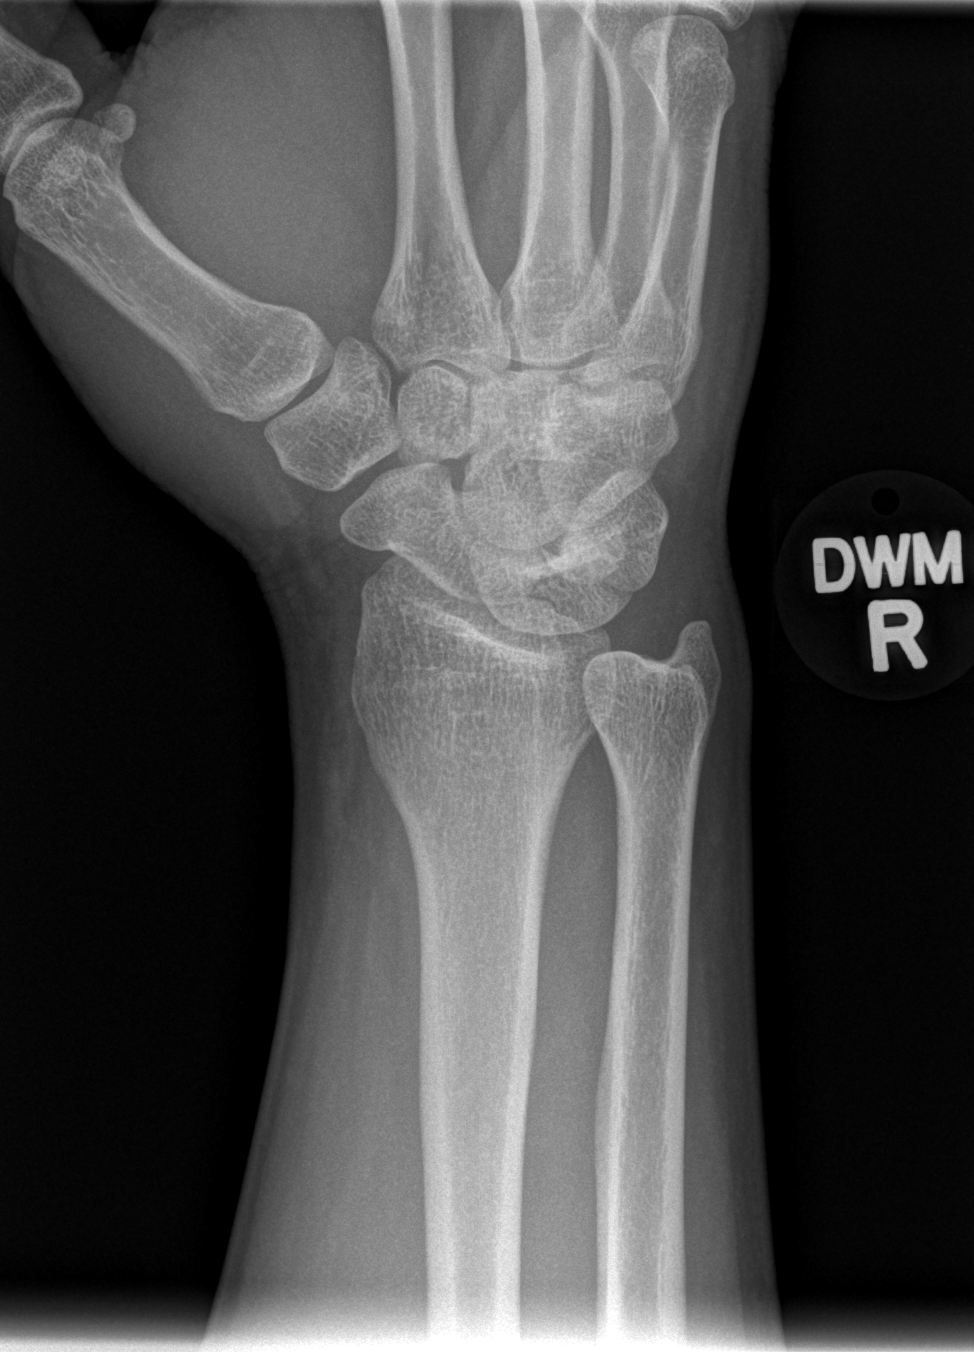

[x wrist lat right]
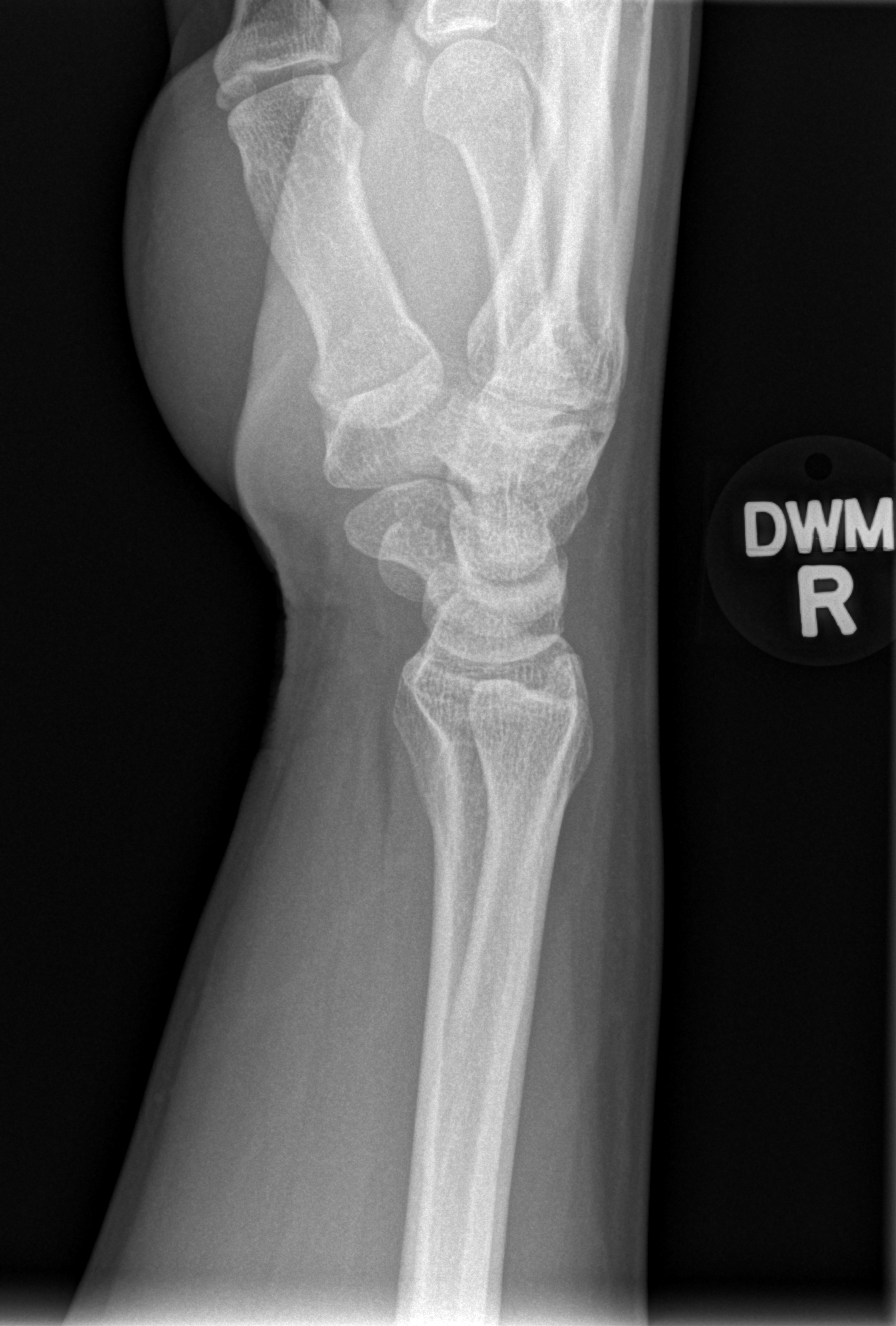

[x navicular]
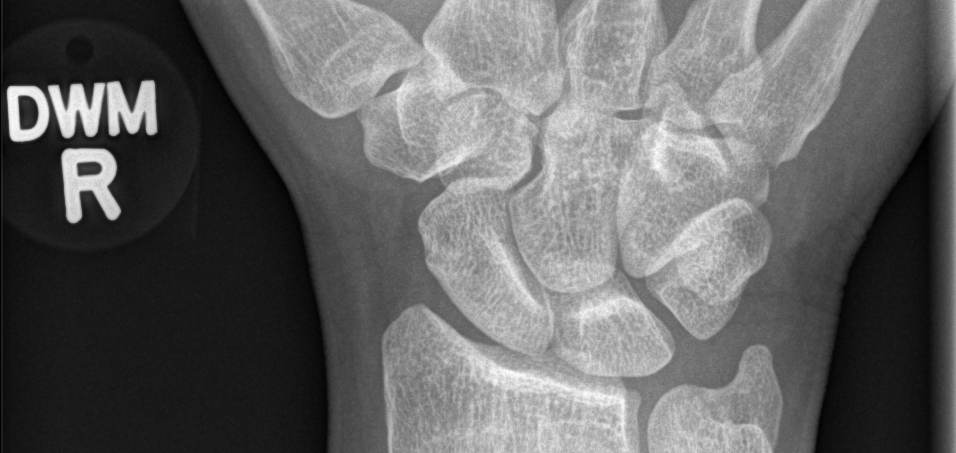

[4 of 4 positions shown; findings below may reference images not displayed]

FINDINGS: There is no evidence of fracture or dislocation. There is no
evidence of arthropathy or other focal bone abnormality. Soft
tissues are unremarkable.
IMPRESSION: Negative.
